# Patient Record
Sex: Male | Born: 2000 | Race: White | Hispanic: No | Marital: Single
Health system: Southern US, Community
[De-identification: ages and names within clinical notes are randomized; demographics above are authoritative.]

## PROBLEM LIST (undated history)

## (undated) DIAGNOSIS — T4145XA Adverse effect of unspecified anesthetic, initial encounter: Secondary | ICD-10-CM

## (undated) DIAGNOSIS — Z9109 Other allergy status, other than to drugs and biological substances: Secondary | ICD-10-CM

## (undated) DIAGNOSIS — W57XXXA Bitten or stung by nonvenomous insect and other nonvenomous arthropods, initial encounter: Secondary | ICD-10-CM

## (undated) DIAGNOSIS — G43909 Migraine, unspecified, not intractable, without status migrainosus: Secondary | ICD-10-CM

## (undated) DIAGNOSIS — J45909 Unspecified asthma, uncomplicated: Secondary | ICD-10-CM

## (undated) DIAGNOSIS — L709 Acne, unspecified: Secondary | ICD-10-CM

## (undated) DIAGNOSIS — J189 Pneumonia, unspecified organism: Secondary | ICD-10-CM

## (undated) DIAGNOSIS — IMO0002 Reserved for concepts with insufficient information to code with codable children: Secondary | ICD-10-CM

## (undated) DIAGNOSIS — J302 Other seasonal allergic rhinitis: Secondary | ICD-10-CM

## (undated) DIAGNOSIS — N159 Renal tubulo-interstitial disease, unspecified: Secondary | ICD-10-CM

## (undated) DIAGNOSIS — J392 Other diseases of pharynx: Secondary | ICD-10-CM

## (undated) DIAGNOSIS — T8859XA Other complications of anesthesia, initial encounter: Secondary | ICD-10-CM

## (undated) DIAGNOSIS — A492 Hemophilus influenzae infection, unspecified site: Secondary | ICD-10-CM

## (undated) HISTORY — PX: TONSILLECTOMY: SUR1361

## (undated) HISTORY — PX: FINGER SURGERY: SHX640

## (undated) HISTORY — PX: ADENOIDECTOMY: SUR15

## (undated) HISTORY — PX: ADENOIDECTOMY: SHX5191

## (undated) HISTORY — PX: RECTAL SURGERY: SHX760

---
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
## 2019-12-19 ENCOUNTER — Telehealth: Payer: Self-pay | Admitting: *Deleted

## 2019-12-19 NOTE — Telephone Encounter (Signed)
Pt's mtr, Tresa Endo called requesting refill of the Mobic. Dr. Bary Castilla #[id number], pt needs an appt prior to future refills.

## 2019-12-19 NOTE — Telephone Encounter (Signed)
Left message informing pt's mtr, Tresa Endo Dr. Ardelle Anton had refilled the Mobic for 30 days and it had been over 2 years since pt had been seen and if he needed future refills he would need an appt, refill sent to [facility].

## 2019-12-21 ENCOUNTER — Other Ambulatory Visit: Payer: Self-pay | Admitting: Podiatry

## 2019-12-21 MED ORDER — MELOXICAM 7.5 MG PO TABS: ORAL_TABLET | ORAL | 0 refills | Status: AC

## 2019-12-21 NOTE — Telephone Encounter (Signed)
Pts mother called following up on refill request. Please send to [facility] in [place]

## 2019-12-21 NOTE — Telephone Encounter (Signed)
I sent the mobic to the pharmacy but I would recommend taking it only as needed. If this is something that he is requiring daily or having issues I would like to see him.
